# Patient Record
Sex: Male | Born: 1935 | Race: White | Hispanic: No | Smoking: Former smoker
Health system: Southern US, Community
[De-identification: ages and names within clinical notes are randomized; demographics above are authoritative.]

## PROBLEM LIST (undated history)

## (undated) DIAGNOSIS — F0280 Dementia in other diseases classified elsewhere without behavioral disturbance: Secondary | ICD-10-CM

## (undated) DIAGNOSIS — D509 Iron deficiency anemia, unspecified: Secondary | ICD-10-CM

## (undated) DIAGNOSIS — Z126 Encounter for screening for malignant neoplasm of bladder: Secondary | ICD-10-CM

---

## 2020-06-14 ENCOUNTER — Other Ambulatory Visit: Payer: Self-pay

## 2020-06-14 ENCOUNTER — Emergency Department (HOSPITAL_COMMUNITY): Payer: Medicare Other

## 2020-06-14 ENCOUNTER — Emergency Department (HOSPITAL_COMMUNITY)
Admission: EM | Admit: 2020-06-14 | Discharge: 2020-06-14 | Disposition: A | Discharge status: Home / Self Care | Payer: Medicare Other | Attending: Emergency Medicine | Admitting: Emergency Medicine

## 2020-06-14 ENCOUNTER — Encounter (HOSPITAL_COMMUNITY): Payer: Self-pay | Admitting: Emergency Medicine

## 2020-06-14 DIAGNOSIS — W19XXXA Unspecified fall, initial encounter: Secondary | ICD-10-CM | POA: Diagnosis not present

## 2020-06-14 DIAGNOSIS — S0993XA Unspecified injury of face, initial encounter: Secondary | ICD-10-CM | POA: Diagnosis present

## 2020-06-14 DIAGNOSIS — S0011XA Contusion of right eyelid and periocular area, initial encounter: Secondary | ICD-10-CM | POA: Diagnosis not present

## 2020-06-14 DIAGNOSIS — S0083XA Contusion of other part of head, initial encounter: Secondary | ICD-10-CM | POA: Diagnosis not present

## 2020-06-14 NOTE — ED Triage Notes (Signed)
BIBA Per EMS: Pt coming from [facility] with an unwitnessed fall. Pt states he doesn't remember falling but does have a black eye.  denies blood thinners  Hx dementia. Pt ambulatory. All vitals WDL.

## 2020-06-14 NOTE — Discharge Instructions (Addendum)
CT scan head neck and face without any brain or bony injury.  There is soft tissue bruising and swelling around the right eye area.  Patient stable for discharge back to nursing facility.  No specific therapy needed.

## 2020-06-14 NOTE — ED Provider Notes (Signed)
[facility]-EMERGENCY DEPT Provider Note   CSN: [id number] Arrival date & time: 06/14/20  1502     History Chief Complaint  Patient presents with  . Fall    Marcus Hansen is a 87 y.o. male.  Patient brought in from [facility].  Sounds like there was a unwitnessed fall 2 days ago.  Patient with a lot of bruising around his right eye and right forehead area.  Patient has been able to ambulate.  Patient has a history of dementia.  But no specific complaints.        History reviewed. No pertinent past medical history.  There are no problems to display for this patient.        History reviewed. No pertinent family history.     Home Medications Prior to Admission medications   Not on File    Allergies    Patient has no allergy information on record.  Review of Systems   Review of Systems  Unable to perform ROS: Dementia    Physical Exam Updated Vital Signs BP 140/62 (BP Location: Right Arm)   Pulse 64   Temp 98.1 F (36.7 C) (Oral)   Resp 18   SpO2 96%   Physical Exam Vitals and nursing note reviewed.  Constitutional:      General: He is not in acute distress.    Appearance: He is well-developed.  HENT:     Head: Normocephalic.     Comments: Dark contusion to the right forehead area and around the right eye. Eyes:     Extraocular Movements: Extraocular movements intact.     Pupils: Pupils are equal, round, and reactive to light.     Comments: Right eye with some blood in the sclera.  No hyphema.  Extraocular muscles are intact.  Cardiovascular:     Rate and Rhythm: Normal rate and regular rhythm.     Heart sounds: No murmur heard.   Pulmonary:     Effort: Pulmonary effort is normal. No respiratory distress.     Breath sounds: Normal breath sounds.  Abdominal:     Palpations: Abdomen is soft.     Tenderness: There is no abdominal tenderness.  Musculoskeletal:        General: Normal range of motion.     Cervical  back: Neck supple.     Comments: No pain or discomfort with range of motion upper extremities lower extremities.  Including at the hip.  Skin:    General: Skin is warm and dry.     Capillary Refill: Capillary refill takes less than 2 seconds.  Neurological:     General: No focal deficit present.     Mental Status: He is alert. Mental status is at baseline.     Cranial Nerves: No cranial nerve deficit.     Sensory: No sensory deficit.     Motor: No weakness.     ED Results / Procedures / Treatments   Labs (all labs ordered are listed, but only abnormal results are displayed) Labs Reviewed - No data to display  EKG None  Radiology CT Head Wo Contrast  Result Date: 06/14/2020 CLINICAL DATA:  Un witnessed fall, ecchymosis EXAM: CT HEAD WITHOUT CONTRAST CT MAXILLOFACIAL WITHOUT CONTRAST CT CERVICAL SPINE WITHOUT CONTRAST TECHNIQUE: Multidetector CT imaging of the head, cervical spine, and maxillofacial structures were performed using the standard protocol without intravenous contrast. Multiplanar CT image reconstructions of the cervical spine and maxillofacial structures were also generated. COMPARISON:  None. FINDINGS: CT HEAD  FINDINGS Brain: No acute infarct or hemorrhage. Lateral ventricles and midline structures are unremarkable. No acute extra-axial fluid collections. No mass effect. Diffuse cerebral atrophy is age appropriate. Vascular: No hyperdense vessel or unexpected calcification. Skull: Minimal right frontal scalp hematoma. No underlying fracture. Remainder of the calvarium is unremarkable. Other: None. CT MAXILLOFACIAL FINDINGS Osseous: No fracture or mandibular dislocation. No destructive process. Orbits: Negative. No traumatic or inflammatory finding. Sinuses: Clear. Soft tissues: Mild right periorbital soft tissue edema. CT CERVICAL SPINE FINDINGS Alignment: Alignment is anatomic. Skull base and vertebrae: No acute fracture. No primary bone lesion or focal pathologic process. Soft  tissues and spinal canal: No prevertebral fluid or swelling. No visible canal hematoma. Disc levels: Multilevel spondylosis and facet hypertrophy, most pronounced at C4-5, C5-6, and C6-7. Upper chest: Airway is patent.  Lung apices are clear. Other: Reconstructed images demonstrate no additional findings. IMPRESSION: 1. No acute intracranial process. 2. No acute facial bone fracture. 3. No acute cervical spine fracture. 4. Minimal right periorbital soft tissue swelling. 5. Multilevel cervical spondylosis and facet hypertrophy. Electronically Signed   By: Sharlet Salina M.D.   On: 06/14/2020 16:55   CT Cervical Spine Wo Contrast  Result Date: 06/14/2020 CLINICAL DATA:  Un witnessed fall, ecchymosis EXAM: CT HEAD WITHOUT CONTRAST CT MAXILLOFACIAL WITHOUT CONTRAST CT CERVICAL SPINE WITHOUT CONTRAST TECHNIQUE: Multidetector CT imaging of the head, cervical spine, and maxillofacial structures were performed using the standard protocol without intravenous contrast. Multiplanar CT image reconstructions of the cervical spine and maxillofacial structures were also generated. COMPARISON:  None. FINDINGS: CT HEAD FINDINGS Brain: No acute infarct or hemorrhage. Lateral ventricles and midline structures are unremarkable. No acute extra-axial fluid collections. No mass effect. Diffuse cerebral atrophy is age appropriate. Vascular: No hyperdense vessel or unexpected calcification. Skull: Minimal right frontal scalp hematoma. No underlying fracture. Remainder of the calvarium is unremarkable. Other: None. CT MAXILLOFACIAL FINDINGS Osseous: No fracture or mandibular dislocation. No destructive process. Orbits: Negative. No traumatic or inflammatory finding. Sinuses: Clear. Soft tissues: Mild right periorbital soft tissue edema. CT CERVICAL SPINE FINDINGS Alignment: Alignment is anatomic. Skull base and vertebrae: No acute fracture. No primary bone lesion or focal pathologic process. Soft tissues and spinal canal: No prevertebral  fluid or swelling. No visible canal hematoma. Disc levels: Multilevel spondylosis and facet hypertrophy, most pronounced at C4-5, C5-6, and C6-7. Upper chest: Airway is patent.  Lung apices are clear. Other: Reconstructed images demonstrate no additional findings. IMPRESSION: 1. No acute intracranial process. 2. No acute facial bone fracture. 3. No acute cervical spine fracture. 4. Minimal right periorbital soft tissue swelling. 5. Multilevel cervical spondylosis and facet hypertrophy. Electronically Signed   By: Sharlet Salina M.D.   On: 06/14/2020 16:55   CT Maxillofacial WO CM  Result Date: 06/14/2020 CLINICAL DATA:  Un witnessed fall, ecchymosis EXAM: CT HEAD WITHOUT CONTRAST CT MAXILLOFACIAL WITHOUT CONTRAST CT CERVICAL SPINE WITHOUT CONTRAST TECHNIQUE: Multidetector CT imaging of the head, cervical spine, and maxillofacial structures were performed using the standard protocol without intravenous contrast. Multiplanar CT image reconstructions of the cervical spine and maxillofacial structures were also generated. COMPARISON:  None. FINDINGS: CT HEAD FINDINGS Brain: No acute infarct or hemorrhage. Lateral ventricles and midline structures are unremarkable. No acute extra-axial fluid collections. No mass effect. Diffuse cerebral atrophy is age appropriate. Vascular: No hyperdense vessel or unexpected calcification. Skull: Minimal right frontal scalp hematoma. No underlying fracture. Remainder of the calvarium is unremarkable. Other: None. CT MAXILLOFACIAL FINDINGS Osseous: No fracture or mandibular dislocation. No destructive  process. Orbits: Negative. No traumatic or inflammatory finding. Sinuses: Clear. Soft tissues: Mild right periorbital soft tissue edema. CT CERVICAL SPINE FINDINGS Alignment: Alignment is anatomic. Skull base and vertebrae: No acute fracture. No primary bone lesion or focal pathologic process. Soft tissues and spinal canal: No prevertebral fluid or swelling. No visible canal hematoma.  Disc levels: Multilevel spondylosis and facet hypertrophy, most pronounced at C4-5, C5-6, and C6-7. Upper chest: Airway is patent.  Lung apices are clear. Other: Reconstructed images demonstrate no additional findings. IMPRESSION: 1. No acute intracranial process. 2. No acute facial bone fracture. 3. No acute cervical spine fracture. 4. Minimal right periorbital soft tissue swelling. 5. Multilevel cervical spondylosis and facet hypertrophy. Electronically Signed   By: Sharlet Salina M.D.   On: 06/14/2020 16:55    Procedures Procedures   Medications Ordered in ED Medications - No data to display  ED Course  I have reviewed the triage vital signs and the nursing notes.  Pertinent labs & imaging results that were available during my care of the patient were reviewed by me and considered in my medical decision making (see chart for details).    MDM Rules/Calculators/A&P                          Patient daughter states he is not on blood thinners.  Appears that the fall probably occurred 2 days ago.  Daughter was contacted yesterday about fall.  CT head neck and face without any acute bony abnormalities or any injury to the brain.  Patient is ambulatory was prior to arrival.  Does not seem to have any pain in the hips or lower extremities or upper extremities.  The right eye does not show evidence of hyphema.  There is a little bit of sclera hemorrhage.  Extraocular muscles are intact.  No signs of entrapment.  Patient stable for discharge back to [facility].    Final Clinical Impression(s) / ED Diagnoses Final diagnoses:  Fall, initial encounter  Contusion of face, initial encounter    Rx / DC Orders ED Discharge Orders    None       Vanetta Mulders, MD 06/14/20 1725

## 2020-11-03 ENCOUNTER — Other Ambulatory Visit: Payer: Self-pay

## 2020-11-03 ENCOUNTER — Emergency Department (HOSPITAL_BASED_OUTPATIENT_CLINIC_OR_DEPARTMENT_OTHER)
Admission: EM | Admit: 2020-11-03 | Discharge: 2020-11-03 | Disposition: A | Discharge status: Home / Self Care | Payer: Medicare Other | Attending: Emergency Medicine | Admitting: Emergency Medicine

## 2020-11-03 ENCOUNTER — Emergency Department (HOSPITAL_BASED_OUTPATIENT_CLINIC_OR_DEPARTMENT_OTHER): Payer: Medicare Other

## 2020-11-03 ENCOUNTER — Encounter (HOSPITAL_BASED_OUTPATIENT_CLINIC_OR_DEPARTMENT_OTHER): Payer: Self-pay | Admitting: *Deleted

## 2020-11-03 DIAGNOSIS — S0181XA Laceration without foreign body of other part of head, initial encounter: Secondary | ICD-10-CM | POA: Diagnosis not present

## 2020-11-03 DIAGNOSIS — F0280 Dementia in other diseases classified elsewhere without behavioral disturbance: Secondary | ICD-10-CM | POA: Diagnosis not present

## 2020-11-03 DIAGNOSIS — S0990XA Unspecified injury of head, initial encounter: Secondary | ICD-10-CM | POA: Diagnosis present

## 2020-11-03 DIAGNOSIS — W01198A Fall on same level from slipping, tripping and stumbling with subsequent striking against other object, initial encounter: Secondary | ICD-10-CM | POA: Insufficient documentation

## 2020-11-03 DIAGNOSIS — G309 Alzheimer's disease, unspecified: Secondary | ICD-10-CM | POA: Diagnosis not present

## 2020-11-03 DIAGNOSIS — Z87891 Personal history of nicotine dependence: Secondary | ICD-10-CM | POA: Diagnosis not present

## 2020-11-03 HISTORY — DX: Iron deficiency anemia, unspecified: D50.9

## 2020-11-03 HISTORY — DX: Dementia in other diseases classified elsewhere, unspecified severity, without behavioral disturbance, psychotic disturbance, mood disturbance, and anxiety: F02.80

## 2020-11-03 HISTORY — DX: Encounter for screening for malignant neoplasm of bladder: Z12.6

## 2020-11-03 MED ORDER — LIDOCAINE HCL (PF) 1 % IJ SOLN
10.0000 mL | Freq: Once | INTRAMUSCULAR | Status: AC
  Administered 2020-11-03: 10 mL via INTRADERMAL
  Filled 2020-11-03: qty 10

## 2020-11-03 NOTE — Discharge Instructions (Addendum)
The CT scan today of Marcus Hansen's brain did not show any signs of a brain bleed or skull fracture.  I put 5 stitches in his forehead.  The stitches will need to be removed in 5 days (Monday Sept 5th).  This can be done by his primary care provider, at an urgent care, or any medical facility.  Please keep his wound dry for 24 hours.  After that he can take down the Kerlex bandage and leave the wound open to air, and he can also shower normally.  It is okay if the wound gets wet, as long as it is gently dried off.

## 2020-11-03 NOTE — ED Provider Notes (Signed)
[facility] EMERGENCY DEPT Provider Note   CSN: [id number] Arrival date & time: 11/03/20  1211     History Chief Complaint  Patient presents with   Fall   Laceration    Marcus Hansen is a 87 y.o. male with history of Alzheimer's dementia presenting from nursing facility with concern for fall and head injury.  Patient reports that he lost his footing and fell forward striking his forehead on the ground.  He has a laceration to the forehead.  No loss of consciousness.  He reports he is not on blood thinners and per his med rec review does not appear to be on any anticoagulation.  He denies injuries or pain anywhere else.  HPI     Past Medical History:  Diagnosis Date   Alzheimer's disease (HCC)    Iron deficiency anemia    Screening, malignant neoplasm, bladder     There are no problems to display for this patient.   History reviewed. No pertinent surgical history.     History reviewed. No pertinent family history.  Social History   Tobacco Use   Smoking status: Former    Types: Cigarettes   Smokeless tobacco: Never  Vaping Use   Vaping Use: Never used  Substance Use Topics   Alcohol use: Not Currently   Drug use: Never    Home Medications Prior to Admission medications   Medication Sig Start Date End Date Taking? Authorizing Provider  cetirizine (ZYRTEC) 5 MG tablet Take 5 mg by mouth daily.   Yes [provider]  donepezil (ARICEPT) 10 MG tablet Take 10 mg by mouth at bedtime.   Yes [provider]  meloxicam (MOBIC) 7.5 MG tablet Take 7.5 mg by mouth daily.   Yes [provider]  memantine (NAMENDA) 5 MG tablet Take 5 mg by mouth 2 (two) times daily.   Yes [provider]  omeprazole (PRILOSEC) 40 MG capsule Take 40 mg by mouth daily.   Yes [provider]  QUEtiapine (SEROQUEL) 25 MG tablet Take 25 mg by mouth at bedtime.   Yes [provider]    Allergies    Patient has no known  allergies.  Review of Systems   Review of Systems  Constitutional:  Negative for chills and fever.  HENT:  Negative for ear pain and sore throat.   Eyes:  Negative for pain and visual disturbance.  Respiratory:  Negative for cough and shortness of breath.   Cardiovascular:  Negative for chest pain and palpitations.  Gastrointestinal:  Negative for abdominal pain and vomiting.  Genitourinary:  Negative for dysuria and hematuria.  Musculoskeletal:  Negative for arthralgias and back pain.  Skin:  Negative for color change and rash.  Neurological:  Negative for syncope, light-headedness and headaches.  All other systems reviewed and are negative.  Physical Exam Updated Vital Signs BP (!) 143/65 (BP Location: Right Arm)   Pulse 60   Temp 98.7 F (37.1 C)   Resp 18   Ht 6\' 3"  (1.905 m)   Wt 75.8 kg   SpO2 97%   BMI 20.87 kg/m   Physical Exam Constitutional:      General: He is not in acute distress. HENT:     Head: Normocephalic.     Comments: 1.5 inch linear vertical laceration down mid forehead Eyes:     Conjunctiva/sclera: Conjunctivae normal.     Pupils: Pupils are equal, round, and reactive to light.  Cardiovascular:     Rate and Rhythm: Normal  rate and regular rhythm.  Pulmonary:     Effort: Pulmonary effort is normal. No respiratory distress.  Abdominal:     General: There is no distension.     Tenderness: There is no abdominal tenderness.  Skin:    General: Skin is warm and dry.  Neurological:     General: No focal deficit present.     Mental Status: He is alert. Mental status is at baseline.    ED Results / Procedures / Treatments   Labs (all labs ordered are listed, but only abnormal results are displayed) Labs Reviewed - No data to display  EKG None  Radiology CT HEAD WO CONTRAST ( )  Result Date: 11/03/2020 CLINICAL DATA:  Head trauma, minor (Age >= 65y) forehead injury. Forehead injury. Additional history provided: Fall this morning, laceration  to forehead. EXAM: CT HEAD WITHOUT CONTRAST TECHNIQUE: Contiguous axial images were obtained from the base of the skull through the vertex without intravenous contrast. COMPARISON:  CT head/maxillofacial 06/14/2020. FINDINGS: Brain: Mild to moderate generalized cerebral atrophy. Mild patchy and ill-defined hypoattenuation within the white matter, nonspecific but compatible with chronic small vessel ischemic disease. There is no acute intracranial hemorrhage. No demarcated cortical infarct. No extra-axial fluid collection. No evidence of an intracranial mass. No midline shift. 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 Vascular: No hyperdense vessel.  Atherosclerotic calcifications. Skull: Normal. Negative for fracture or focal lesion. Sinuses/Orbits: Visualized orbits show no acute finding. Mild scattered paranasal sinus mucosal thickening at the imaged levels. Other: Forehead laceration. IMPRESSION: No evidence of acute intracranial abnormality.  Forehead laceration. Mild chronic small-vessel ischemic changes within the white matter. Mild-to-moderate generalized cerebral atrophy. Mild paranasal sinus mucosal thickening at the imaged levels. Electronically Signed   By: Jackey Loge D.O.   On: 11/03/2020 13:12    Procedures .Marland KitchenLaceration Repair  Date/Time: 11/03/2020 4:49 PM Performed by: Terald Sleeper, MD Authorized by: Terald Sleeper, MD   Consent:    Consent obtained:  Verbal   Risks discussed:  Infection, pain, poor cosmetic result, poor wound healing and need for additional repair Universal protocol:    Procedure explained and questions answered to patient or proxy's satisfaction: yes     Relevant documents present and verified: yes     Immediately prior to procedure, a time out was called: yes     Patient identity confirmed:  Arm band Anesthesia:    Anesthesia method:  Local infiltration   Local anesthetic:  Lidocaine 1% w/o epi Laceration details:     Location:  Face   Face location:  Forehead   Length (cm):  5   Depth (mm):  3 Pre-procedure details:    Preparation:  Patient was prepped and draped in usual sterile fashion Exploration:    Imaging outcome: foreign body not noted     Wound exploration: wound explored through full range of motion and entire depth of wound visualized     Contaminated: no   Treatment:    Area cleansed with:  Saline   Amount of cleaning:  Standard Skin repair:    Repair method:  Sutures   Suture size:  4-0   Suture material:  Prolene   Suture technique:  Simple interrupted   Number of sutures:  5 Approximation:    Approximation:  Close Repair type:    Repair type:  Simple Post-procedure details:    Dressing:  Adhesive bandage   Procedure completion:  Tolerated well, no immediate complications   Medications Ordered in ED  Medications  lidocaine (PF) (XYLOCAINE) 1 % injection 10 mL (10 mLs Intradermal Given 11/03/20 1336)    ED Course  I have reviewed the triage vital signs and the nursing notes.  Pertinent labs & imaging results that were available during my care of the patient were reviewed by me and considered in my medical decision making (see chart for details).  Patient is here with suspected mechanical fall and for head injury.  CT scan of the brain ordered.  No other acute injuries noted my exam.  He does have a laceration to forehead which will need to be irrigated and require suture closure.  Patient is otherwise well-appearing.`  Laceration irrigated and repaired and dressed at bedside.  Clinical Course as of 11/03/20 1649  Wed Nov 03, 2020  1329  IMPRESSION: No evidence of acute intracranial abnormality.  Forehead laceration.   Mild chronic small-vessel ischemic changes within the white matter.   Mild-to-moderate generalized cerebral atrophy.   Mild paranasal sinus mucosal thickening at the imaged levels.  [MT]    Clinical Course User Index [MT] Goldman Birchall, Kermit Balo, MD     Final Clinical Impression(s) / ED Diagnoses Final diagnoses:  Laceration of forehead, initial encounter    Rx / DC Orders ED Discharge Orders     None        Terald Sleeper, MD 11/03/20 1650

## 2020-11-03 NOTE — ED Notes (Signed)
Dr. Renaye Rakers is suturing his forehead wound as I write this.

## 2020-11-03 NOTE — ED Notes (Signed)
He remains comfortable. His niece phones and tells [place] she will come here to pick him up within ~20 min. I have just notified [facility] to cancel this transport request.

## 2020-11-03 NOTE — ED Triage Notes (Addendum)
Larey Seat this morning and per report patient fell d/t mechanical fall???  Patient does not know how he fall.  Laceration to forehead.

## 2020-11-30 ENCOUNTER — Other Ambulatory Visit: Payer: Self-pay

## 2020-11-30 ENCOUNTER — Emergency Department (HOSPITAL_COMMUNITY)
Admission: EM | Admit: 2020-11-30 | Discharge: 2020-12-01 | Disposition: A | Discharge status: Home / Self Care | Payer: Medicare Other | Attending: Emergency Medicine | Admitting: Emergency Medicine

## 2020-11-30 ENCOUNTER — Encounter (HOSPITAL_COMMUNITY): Payer: Self-pay | Admitting: Emergency Medicine

## 2020-11-30 ENCOUNTER — Emergency Department (HOSPITAL_COMMUNITY): Payer: Medicare Other

## 2020-11-30 DIAGNOSIS — S0081XA Abrasion of other part of head, initial encounter: Secondary | ICD-10-CM | POA: Insufficient documentation

## 2020-11-30 DIAGNOSIS — R059 Cough, unspecified: Secondary | ICD-10-CM | POA: Diagnosis not present

## 2020-11-30 DIAGNOSIS — G309 Alzheimer's disease, unspecified: Secondary | ICD-10-CM | POA: Diagnosis not present

## 2020-11-30 DIAGNOSIS — S0990XA Unspecified injury of head, initial encounter: Secondary | ICD-10-CM | POA: Diagnosis present

## 2020-11-30 DIAGNOSIS — E876 Hypokalemia: Secondary | ICD-10-CM

## 2020-11-30 DIAGNOSIS — R197 Diarrhea, unspecified: Secondary | ICD-10-CM | POA: Diagnosis not present

## 2020-11-30 DIAGNOSIS — Z20822 Contact with and (suspected) exposure to covid-19: Secondary | ICD-10-CM | POA: Insufficient documentation

## 2020-11-30 DIAGNOSIS — X58XXXA Exposure to other specified factors, initial encounter: Secondary | ICD-10-CM | POA: Insufficient documentation

## 2020-11-30 DIAGNOSIS — Z87891 Personal history of nicotine dependence: Secondary | ICD-10-CM | POA: Diagnosis not present

## 2020-11-30 DIAGNOSIS — K591 Functional diarrhea: Secondary | ICD-10-CM

## 2020-11-30 LAB — COMPREHENSIVE METABOLIC PANEL
ALT: 9 U/L (ref 0–44)
AST: 14 U/L — ABNORMAL LOW (ref 15–41)
Albumin: 3.1 g/dL — ABNORMAL LOW (ref 3.5–5.0)
Alkaline Phosphatase: 52 U/L (ref 38–126)
Anion gap: 5 (ref 5–15)
BUN: 18 mg/dL (ref 8–23)
CO2: 28 mmol/L (ref 22–32)
Calcium: 8.7 mg/dL — ABNORMAL LOW (ref 8.9–10.3)
Chloride: 110 mmol/L (ref 98–111)
Creatinine, Ser: 0.78 mg/dL (ref 0.61–1.24)
GFR, Estimated: >60 mL/min (ref >60)
Glucose, Bld: 94 mg/dL (ref 70–99)
Potassium: 2.9 mmol/L — ABNORMAL LOW (ref 3.5–5.1)
Sodium: 143 mmol/L (ref 135–145)
Total Bilirubin: 0.8 mg/dL (ref 0.3–1.2)
Total Protein: 5.4 g/dL — ABNORMAL LOW (ref 6.5–8.1)

## 2020-11-30 LAB — PROTIME-INR
INR: 1 (ref 0.8–1.2)
Prothrombin Time: 13.6 seconds (ref 11.4–15.2)

## 2020-11-30 LAB — CBC
HCT: 39.1 % (ref 39.0–52.0)
Hemoglobin: 12.9 g/dL — ABNORMAL LOW (ref 13.0–17.0)
MCH: 29.9 pg (ref 26.0–34.0)
MCHC: 33 g/dL (ref 30.0–36.0)
MCV: 90.7 fL (ref 80.0–100.0)
Platelets: 163 10*3/uL (ref 150–400)
RBC: 4.31 MIL/uL (ref 4.22–5.81)
RDW: 13.3 % (ref 11.5–15.5)
WBC: 7.5 10*3/uL (ref 4.0–10.5)
nRBC: 0 % (ref 0.0–0.2)

## 2020-11-30 LAB — APTT: aPTT: 32 seconds (ref 24–36)

## 2020-11-30 LAB — LACTIC ACID, PLASMA: Lactic Acid, Venous: 0.9 mmol/L (ref 0.5–1.9)

## 2020-11-30 LAB — LIPASE, BLOOD: Lipase: 26 U/L (ref 11–51)

## 2020-11-30 MED ORDER — POTASSIUM CHLORIDE 10 MEQ/100ML IV SOLN
10.0000 meq | INTRAVENOUS | Status: AC
  Administered 2020-12-01 (×2): 10 meq via INTRAVENOUS
  Filled 2020-11-30: qty 100

## 2020-11-30 MED ORDER — LACTATED RINGERS IV BOLUS
1000.0000 mL | Freq: Once | INTRAVENOUS | Status: AC
  Administered 2020-11-30: 1000 mL via INTRAVENOUS

## 2020-11-30 NOTE — ED Triage Notes (Signed)
Per EMS-diarrhea for 2 days-temp of 103.2-facility worried that he is dehydrated-decreased appetite for 2 days-1000 mg of tylenol and 500 mg of NS given in route-history of dementia

## 2020-11-30 NOTE — ED Notes (Signed)
Daughter at bedside.

## 2020-11-30 NOTE — ED Provider Notes (Signed)
[facility]-EMERGENCY DEPT Provider Note   CSN: [id number] Arrival date & time: 11/30/20  1833     History Chief Complaint  Patient presents with   Diarrhea    Marcus Hansen is a 87 y.o. male.  Patient is an 87 year old male with a history of Alzheimer's disease who lives in a facility being brought in today by EMS due to facility reporting 2 to 3 days of diarrhea, decreased oral intake today and fever of 103.2.  They denied that patient had any cough, congestion or shortness of breath.  He has not had any vomiting.  No blood in the stool.  Patient denies having any abdominal pain or chest pain at this time.  He does not know why he is here and cannot give any further history.  The history is provided by the EMS personnel and medical records. The history is limited by the absence of a caregiver.  Diarrhea     Past Medical History:  Diagnosis Date   Alzheimer's disease (HCC)    Iron deficiency anemia    Screening, malignant neoplasm, bladder     There are no problems to display for this patient.   History reviewed. No pertinent surgical history.     No family history on file.  Social History   Tobacco Use   Smoking status: Former    Types: Cigarettes   Smokeless tobacco: Never  Vaping Use   Vaping Use: Never used  Substance Use Topics   Alcohol use: Not Currently   Drug use: Never    Home Medications Prior to Admission medications   Medication Sig Start Date End Date Taking? Authorizing Provider  cetirizine (ZYRTEC) 5 MG tablet Take 5 mg by mouth daily.   Yes [provider]  donepezil (ARICEPT) 10 MG tablet Take 10 mg by mouth at bedtime.   Yes [provider]  meloxicam (MOBIC) 7.5 MG tablet Take 7.5 mg by mouth daily.   Yes [provider]  memantine (NAMENDA) 5 MG tablet Take 5 mg by mouth 2 (two) times daily.   Yes [provider]  omeprazole (PRILOSEC) 40 MG capsule Take 40 mg by mouth daily.    Yes [provider]    Allergies    Patient has no known allergies.  Review of Systems   Review of Systems  Unable to perform ROS: Dementia  Gastrointestinal:  Positive for diarrhea.   Physical Exam Updated Vital Signs BP (!) 150/48   Pulse (!) 30   Temp 98 F (36.7 C) (Rectal)   Resp 16   SpO2 94%   Physical Exam Vitals and nursing note reviewed.  Constitutional:      General: He is not in acute distress.    Appearance: He is well-developed.  HENT:     Head: Normocephalic.     Comments: Healing abrasion over the upper central forehead    Nose: Nose normal.     Mouth/Throat:     Mouth: Mucous membranes are moist.  Eyes:     Conjunctiva/sclera: Conjunctivae normal.     Pupils: Pupils are equal, round, and reactive to light.  Cardiovascular:     Rate and Rhythm: Normal rate and regular rhythm.     Heart sounds: No murmur heard. Pulmonary:     Effort: Pulmonary effort is normal. No respiratory distress.     Breath sounds: Normal breath sounds. No wheezing or rales.  Abdominal:     General: There is no distension.  Palpations: Abdomen is soft.     Tenderness: There is no abdominal tenderness. There is no guarding or rebound.  Musculoskeletal:        General: No tenderness. Normal range of motion.     Cervical back: Normal range of motion and neck supple.     Right lower leg: No edema.     Left lower leg: No edema.     Comments: Dried stool on the legs  Skin:    General: Skin is warm and dry.     Findings: No erythema or rash.  Neurological:     Mental Status: He is alert.     Comments: Oriented to person only  Psychiatric:     Comments: Calm and cooperative    ED Results / Procedures / Treatments   Labs (all labs ordered are listed, but only abnormal results are displayed) Labs Reviewed  COMPREHENSIVE METABOLIC PANEL - Abnormal; Notable for the following components:      Result Value   Potassium 2.9 (*)    Calcium 8.7 (*)    Total Protein  5.4 (*)    Albumin 3.1 (*)    AST 14 (*)    All other components within normal limits  CBC - Abnormal; Notable for the following components:   Hemoglobin 12.9 (*)    All other components within normal limits  RESP PANEL BY RT-PCR (FLU A&B, COVID) [id number]  CULTURE, BLOOD (SINGLE)  URINE CULTURE  GASTROINTESTINAL PANEL BY PCR, STOOL (REPLACES STOOL CULTURE)  LIPASE, BLOOD  LACTIC ACID, PLASMA  PROTIME-INR  APTT  URINALYSIS, ROUTINE W REFLEX MICROSCOPIC  LACTIC ACID, PLASMA    EKG EKG Interpretation  Date/Time:  Tuesday November 30 2020 21:06:16 EDT Ventricular Rate:  53 PR Interval:  117 QRS Duration: 132 QT Interval:  513 QTC Calculation: 482 R Axis:     Text Interpretation: EASI Derived Leads T wave inversions in the inferior leads No prior EKG for comparison No STEMI Confirmed by Ernie Avena ([id number]) on 11/30/2020 9:41:49 PM  Radiology DG Chest Port 1 View  Result Date: 11/30/2020 CLINICAL DATA:  Question sepsis. EXAM: PORTABLE CHEST 1 VIEW COMPARISON:  None. FINDINGS: The heart and mediastinal contours are within normal limits. Slightly prominent hilar vasculature. No focal consolidation. No pulmonary edema. No pleural effusion. No pneumothorax. No acute osseous abnormality. Old healed left proximal humerus fracture. IMPRESSION: No active disease. Electronically Signed   By: Tish Frederickson M.D.   On: 11/30/2020 20:08    Procedures Procedures   Medications Ordered in ED Medications  potassium chloride 10 mEq in 100 mL IVPB (10 mEq Intravenous New Bag/Given 12/01/20 0009)  loperamide (IMODIUM) capsule 4 mg (has no administration in time range)  lactated ringers infusion (has no administration in time range)  lactated ringers bolus 1,000 mL (1,000 mLs Intravenous New Bag/Given 11/30/20 2300)    ED Course  I have reviewed the triage vital signs and the nursing notes.  Pertinent labs & imaging results that were available during my care of the patient were reviewed by me  and considered in my medical decision making (see chart for details).    MDM Rules/Calculators/A&P                           Elderly male presenting today due to concern for sepsis.  He has had several days of diarrhea and today had a temperature of 103.  He has had poor oral intake today and facility was  concerned about dehydration.  On exam patient has no abdominal pain, chest is clear and oxygen saturation of 96% on room air.  Undifferentiated sepsis possibly initiated.  Still waiting on a temperature here.  Patient given IV fluids.  Labs are pending.  12:30 AM Rectal temperature here is normal at 98.  Chest x-ray, lab work without acute findings except for hypokalemia of 2.9 which is most likely related to patient's diarrhea.  Family member is now here who reports that he has had the diarrhea for a week and poor oral intake and she is concerned about dehydration.  Patient will be replaced with potassium with IV x2 rounds.  Patient is still awake alert and will give a dose of Imodium.  Discussed with family member that he will need GI follow-up with the diarrhea continues but for now can try Imodium to decrease the diarrhea.  She denies any recent antibiotic use to suggest concern for C. difficile.  No other known people at the facility with similar symptoms with less concern for for foodborne illness.  Patient still has no abdominal pain to suggest that he needs any imaging at this time.  UA is still pending.  MDM   Amount and/or Complexity of Data Reviewed Clinical lab tests: ordered and reviewed Tests in the radiology section of CPT: ordered and reviewed Tests in the medicine section of CPT: ordered and reviewed Independent visualization of images, tracings, or specimens: yes    Final Clinical Impression(s) / ED Diagnoses Final diagnoses:  None    Rx / DC Orders ED Discharge Orders     None        Gwyneth Sprout, MD 12/01/20 0031

## 2020-11-30 NOTE — ED Notes (Signed)
Applied condom cath on pt and a brief. Pt laying in bed comfortably w/ HOB elevated.

## 2020-12-01 LAB — RESP PANEL BY RT-PCR (FLU A&B, COVID) ARPGX2
Influenza A by PCR: NEGATIVE
Influenza B by PCR: NEGATIVE
SARS Coronavirus 2 by RT PCR: NEGATIVE

## 2020-12-01 MED ORDER — LOPERAMIDE HCL 2 MG PO CAPS
4.0000 mg | ORAL_CAPSULE | Freq: Once | ORAL | Status: AC
  Administered 2020-12-01: 4 mg via ORAL
  Filled 2020-12-01: qty 2

## 2020-12-01 MED ORDER — LOPERAMIDE HCL 2 MG PO CAPS: 2.0000 mg | ORAL_CAPSULE | Freq: Four times a day (QID) | ORAL | 0 refills | Status: AC | PRN

## 2020-12-01 MED ORDER — LACTATED RINGERS IV SOLN: INTRAVENOUS | Status: DC

## 2020-12-01 MED ORDER — POTASSIUM CHLORIDE CRYS ER 20 MEQ PO TBCR
40.0000 meq | EXTENDED_RELEASE_TABLET | Freq: Once | ORAL | Status: AC
  Administered 2020-12-01: 40 meq via ORAL
  Filled 2020-12-01: qty 2

## 2020-12-01 NOTE — ED Notes (Signed)
Report given to Dois Davenport at [facility]. Patient was transferred back to the facility by his daughter.

## 2020-12-01 NOTE — ED Provider Notes (Signed)
Nursing notes and vitals signs, including pulse oximetry, reviewed.  Summary of this visit's results, reviewed by myself:  EKG:  EKG Interpretation  Date/Time:  Tuesday November 30 2020 21:06:16 EDT Ventricular Rate:  53 PR Interval:  117 QRS Duration: 132 QT Interval:  513 QTC Calculation: 482 R Axis:     Text Interpretation: EASI Derived Leads T wave inversions in the inferior leads No prior EKG for comparison No STEMI Confirmed by Ernie Avena ([id number]) on 11/30/2020 9:41:49 PM        Labs:  Results for orders placed or performed during the hospital encounter of 11/30/20 (from the past 24 hour(s))  Lipase, blood     Status: None   Collection Time: 11/30/20 11:03 PM  Result Value Ref Range   Lipase 26 11 - 51 U/L  Comprehensive metabolic panel     Status: Abnormal   Collection Time: 11/30/20 11:03 PM  Result Value Ref Range   Sodium 143 135 - 145 mmol/L   Potassium 2.9 (L) 3.5 - 5.1 mmol/L   Chloride 110 98 - 111 mmol/L   CO2 28 22 - 32 mmol/L   Glucose, Bld 94 70 - 99 mg/dL   BUN 18 8 - 23 mg/dL   Creatinine, Ser 5.64 0.61 - 1.24 mg/dL   Calcium 8.7 (L) 8.9 - 10.3 mg/dL   Total Protein 5.4 (L) 6.5 - 8.1 g/dL   Albumin 3.1 (L) 3.5 - 5.0 g/dL   AST 14 (L) 15 - 41 U/L   ALT 9 0 - 44 U/L   Alkaline Phosphatase 52 38 - 126 U/L   Total Bilirubin 0.8 0.3 - 1.2 mg/dL   GFR, Estimated >33 >29 mL/min   Anion gap 5 5 - 15  CBC     Status: Abnormal   Collection Time: 11/30/20 11:03 PM  Result Value Ref Range   WBC 7.5 4.0 - 10.5 K/uL   RBC 4.31 4.22 - 5.81 MIL/uL   Hemoglobin 12.9 (L) 13.0 - 17.0 g/dL   HCT 51.8 84.1 - 66.0 %   MCV 90.7 80.0 - 100.0 fL   MCH 29.9 26.0 - 34.0 pg   MCHC 33.0 30.0 - 36.0 g/dL   RDW 63.0 16.0 - 10.9 %   Platelets 163 150 - 400 K/uL   nRBC 0.0 0.0 - 0.2 %  Lactic acid, plasma     Status: None   Collection Time: 11/30/20 11:03 PM  Result Value Ref Range   Lactic Acid, Venous 0.9 0.5 - 1.9 mmol/L  Protime-INR     Status: None   Collection  Time: 11/30/20 11:03 PM  Result Value Ref Range   Prothrombin Time 13.6 11.4 - 15.2 seconds   INR 1.0 0.8 - 1.2  APTT     Status: None   Collection Time: 11/30/20 11:03 PM  Result Value Ref Range   aPTT 32 24 - 36 seconds  Resp Panel by RT-PCR (Flu A&B, Covid) Nasopharyngeal Swab     Status: None   Collection Time: 11/30/20 11:03 PM   Specimen: Nasopharyngeal Swab; Nasopharyngeal(NP) swabs in vial transport medium  Result Value Ref Range   SARS Coronavirus 2 by RT PCR NEGATIVE NEGATIVE   Influenza A by PCR NEGATIVE NEGATIVE   Influenza B by PCR NEGATIVE NEGATIVE    Imaging Studies: DG Chest Port 1 View  Result Date: 11/30/2020 CLINICAL DATA:  Question sepsis. EXAM: PORTABLE CHEST 1 VIEW COMPARISON:  None. FINDINGS: The heart and mediastinal contours are within normal limits. Slightly prominent  hilar vasculature. No focal consolidation. No pulmonary edema. No pleural effusion. No pneumothorax. No acute osseous abnormality. Old healed left proximal humerus fracture. IMPRESSION: No active disease. Electronically Signed   By: Tish Frederickson M.D.   On: 11/30/2020 20:08    3:28 AM Patient has not had no further diarrhea in the ED.'s potassium has infused.     Alfonzia Woolum, Jonny Ruiz, MD 12/01/20 [phone]

## 2020-12-06 LAB — CULTURE, BLOOD (SINGLE)
Culture: NO GROWTH
Special Requests: ADEQUATE

## 2020-12-09 ENCOUNTER — Emergency Department (HOSPITAL_COMMUNITY): Payer: Medicare Other

## 2020-12-09 ENCOUNTER — Other Ambulatory Visit: Payer: Self-pay

## 2020-12-09 ENCOUNTER — Emergency Department (HOSPITAL_COMMUNITY)
Admission: EM | Admit: 2020-12-09 | Discharge: 2020-12-10 | Disposition: A | Discharge status: Home / Self Care | Payer: Medicare Other | Attending: Emergency Medicine | Admitting: Emergency Medicine

## 2020-12-09 DIAGNOSIS — G309 Alzheimer's disease, unspecified: Secondary | ICD-10-CM | POA: Insufficient documentation

## 2020-12-09 DIAGNOSIS — J90 Pleural effusion, not elsewhere classified: Secondary | ICD-10-CM | POA: Insufficient documentation

## 2020-12-09 DIAGNOSIS — J322 Chronic ethmoidal sinusitis: Secondary | ICD-10-CM | POA: Insufficient documentation

## 2020-12-09 DIAGNOSIS — F0280 Dementia in other diseases classified elsewhere without behavioral disturbance: Secondary | ICD-10-CM | POA: Insufficient documentation

## 2020-12-09 DIAGNOSIS — K5792 Diverticulitis of intestine, part unspecified, without perforation or abscess without bleeding: Secondary | ICD-10-CM | POA: Diagnosis not present

## 2020-12-09 DIAGNOSIS — R197 Diarrhea, unspecified: Secondary | ICD-10-CM | POA: Diagnosis present

## 2020-12-09 DIAGNOSIS — K529 Noninfective gastroenteritis and colitis, unspecified: Secondary | ICD-10-CM | POA: Diagnosis not present

## 2020-12-09 DIAGNOSIS — R296 Repeated falls: Secondary | ICD-10-CM | POA: Diagnosis not present

## 2020-12-09 DIAGNOSIS — R627 Adult failure to thrive: Secondary | ICD-10-CM | POA: Insufficient documentation

## 2020-12-09 DIAGNOSIS — J320 Chronic maxillary sinusitis: Secondary | ICD-10-CM | POA: Insufficient documentation

## 2020-12-09 DIAGNOSIS — Z87891 Personal history of nicotine dependence: Secondary | ICD-10-CM | POA: Diagnosis not present

## 2020-12-09 DIAGNOSIS — I447 Left bundle-branch block, unspecified: Secondary | ICD-10-CM | POA: Insufficient documentation

## 2020-12-09 DIAGNOSIS — K8020 Calculus of gallbladder without cholecystitis without obstruction: Secondary | ICD-10-CM | POA: Diagnosis not present

## 2020-12-09 LAB — URINALYSIS, ROUTINE W REFLEX MICROSCOPIC
Bacteria, UA: NONE SEEN
Bilirubin Urine: NEGATIVE
Glucose, UA: NEGATIVE mg/dL
Hgb urine dipstick: NEGATIVE
Ketones, ur: 20 mg/dL — AB
Leukocytes,Ua: NEGATIVE
Nitrite: NEGATIVE
Protein, ur: 30 mg/dL — AB
Specific Gravity, Urine: 1.025 (ref 1.005–1.030)
pH: 5 (ref 5.0–8.0)

## 2020-12-09 LAB — COMPREHENSIVE METABOLIC PANEL
ALT: 8 U/L (ref 0–44)
AST: 20 U/L (ref 15–41)
Albumin: 2.8 g/dL — ABNORMAL LOW (ref 3.5–5.0)
Alkaline Phosphatase: 52 U/L (ref 38–126)
Anion gap: 9 (ref 5–15)
BUN: 15 mg/dL (ref 8–23)
CO2: 25 mmol/L (ref 22–32)
Calcium: 8.5 mg/dL — ABNORMAL LOW (ref 8.9–10.3)
Chloride: 104 mmol/L (ref 98–111)
Creatinine, Ser: 0.77 mg/dL (ref 0.61–1.24)
GFR, Estimated: >60 mL/min (ref >60)
Glucose, Bld: 83 mg/dL (ref 70–99)
Potassium: 3.8 mmol/L (ref 3.5–5.1)
Sodium: 138 mmol/L (ref 135–145)
Total Bilirubin: 1 mg/dL (ref 0.3–1.2)
Total Protein: 5.4 g/dL — ABNORMAL LOW (ref 6.5–8.1)

## 2020-12-09 LAB — CBC WITH DIFFERENTIAL/PLATELET
Abs Immature Granulocytes: 0.01 10*3/uL (ref 0.00–0.07)
Basophils Absolute: 0.1 10*3/uL (ref 0.0–0.1)
Basophils Relative: 1 %
Eosinophils Absolute: 0.1 10*3/uL (ref 0.0–0.5)
Eosinophils Relative: 2 %
HCT: 39.2 % (ref 39.0–52.0)
Hemoglobin: 12.8 g/dL — ABNORMAL LOW (ref 13.0–17.0)
Immature Granulocytes: 0 %
Lymphocytes Relative: 24 %
Lymphs Abs: 1.5 10*3/uL (ref 0.7–4.0)
MCH: 29.9 pg (ref 26.0–34.0)
MCHC: 32.7 g/dL (ref 30.0–36.0)
MCV: 91.6 fL (ref 80.0–100.0)
Monocytes Absolute: 1.2 10*3/uL — ABNORMAL HIGH (ref 0.1–1.0)
Monocytes Relative: 19 %
Neutro Abs: 3.3 10*3/uL (ref 1.7–7.7)
Neutrophils Relative %: 54 %
Platelets: 171 10*3/uL (ref 150–400)
RBC: 4.28 MIL/uL (ref 4.22–5.81)
RDW: 13.9 % (ref 11.5–15.5)
WBC: 6.2 10*3/uL (ref 4.0–10.5)
nRBC: 0 % (ref 0.0–0.2)

## 2020-12-09 LAB — TROPONIN I (HIGH SENSITIVITY): Troponin I (High Sensitivity): 20 ng/L — ABNORMAL HIGH (ref <18)

## 2020-12-09 MED ORDER — METRONIDAZOLE 500 MG PO TABS: 500.0000 mg | ORAL_TABLET | Freq: Three times a day (TID) | ORAL | 0 refills | Status: AC

## 2020-12-09 MED ORDER — CIPROFLOXACIN IN D5W 400 MG/200ML IV SOLN
400.0000 mg | Freq: Once | INTRAVENOUS | Status: AC
Start: 2020-12-09 — End: 2020-12-10
  Administered 2020-12-10: 400 mg via INTRAVENOUS
  Filled 2020-12-09: qty 200

## 2020-12-09 MED ORDER — SODIUM CHLORIDE 0.9 % IV SOLN: INTRAVENOUS | Status: DC

## 2020-12-09 MED ORDER — METRONIDAZOLE 500 MG/100ML IV SOLN
500.0000 mg | Freq: Once | INTRAVENOUS | Status: AC
  Administered 2020-12-10: 500 mg via INTRAVENOUS
  Filled 2020-12-09: qty 100

## 2020-12-09 MED ORDER — IOHEXOL 350 MG/ML SOLN
80.0000 mL | Freq: Once | INTRAVENOUS | Status: AC | PRN
  Administered 2020-12-09: 80 mL via INTRAVENOUS

## 2020-12-09 MED ORDER — CIPROFLOXACIN HCL 500 MG PO TABS: 500.0000 mg | ORAL_TABLET | Freq: Two times a day (BID) | ORAL | 0 refills | Status: AC

## 2020-12-09 NOTE — ED Triage Notes (Signed)
Per EMS pt from home  Family states he has continued to have symptoms like he was seen here for on 9/27.  No fever  No complaints.  Just not eating/drinking as he should.  Hx of dementia.  Family en route.

## 2020-12-09 NOTE — ED Provider Notes (Signed)
[facility]-EMERGENCY DEPT Provider Note   CSN: [id number] Arrival date & time: 12/09/20  1727     History Chief Complaint  Patient presents with   Failure To Thrive    Marcus Hansen is a 87 y.o. male.  Patient is from assisted living.  Patient brought in by EMS accompanied by family member.  Patient was seen September 27 for diarrhea.  Had some electrolyte abnormalities.  Family states that that he is on Imodium but he is still having frequent diarrhea without any blood and not eating and drinking very well.  Patient has a known history of dementia.  No fever.  No vomiting.  Temp here 98.2.  Blood pressure 144/63.  Heart rate 77.  Oxygen saturation on room air 95%.  Patient known to have Alzheimer's known to have iron deficiency anemia.  Also family states patient has had multiple falls and probably has hit his head.      Past Medical History:  Diagnosis Date   Alzheimer's disease (HCC)    Iron deficiency anemia    Screening, malignant neoplasm, bladder     There are no problems to display for this patient.   No past surgical history on file.     No family history on file.  Social History   Tobacco Use   Smoking status: Former    Types: Cigarettes   Smokeless tobacco: Never  Vaping Use   Vaping Use: Never used  Substance Use Topics   Alcohol use: Not Currently   Drug use: Never    Home Medications Prior to Admission medications   Medication Sig Start Date End Date Taking? Authorizing Provider  ciprofloxacin (CIPRO) 500 MG tablet Take 1 tablet (500 mg total) by mouth every 12 (twelve) hours. 12/09/20  Yes Vanetta Mulders, MD  metroNIDAZOLE (FLAGYL) 500 MG tablet Take 1 tablet (500 mg total) by mouth 3 (three) times daily. 12/09/20  Yes Vanetta Mulders, MD  cetirizine (ZYRTEC) 5 MG tablet Take 5 mg by mouth daily.    [provider]  donepezil (ARICEPT) 10 MG tablet Take 10 mg by mouth at bedtime.    [provider]   loperamide (IMODIUM) 2 MG capsule Take 1 capsule (2 mg total) by mouth 4 (four) times daily as needed for diarrhea or loose stools. 12/01/20   Molpus, John, MD  meloxicam (MOBIC) 7.5 MG tablet Take 7.5 mg by mouth daily.    [provider]  memantine (NAMENDA) 5 MG tablet Take 5 mg by mouth 2 (two) times daily.    [provider]  omeprazole (PRILOSEC) 40 MG capsule Take 40 mg by mouth daily.    [provider]    Allergies    Patient has no known allergies.  Review of Systems   Review of Systems  Gastrointestinal:  Positive for diarrhea.  All other systems reviewed and are negative.  Physical Exam Updated Vital Signs BP (!) 146/59   Pulse (!) 54   Temp 98.2 F (36.8 C) (Oral)   Resp 18   Ht 1.829 m (6')   Wt 75.8 kg   SpO2 94%   BMI 22.65 kg/m   Physical Exam Vitals and nursing note reviewed.  Constitutional:      General: He is not in acute distress.    Appearance: Normal appearance. He is well-developed.  HENT:     Head: Normocephalic and atraumatic.     Mouth/Throat:     Mouth: Mucous membranes are dry.  Eyes:  Extraocular Movements: Extraocular movements intact.     Conjunctiva/sclera: Conjunctivae normal.     Pupils: Pupils are equal, round, and reactive to light.  Cardiovascular:     Rate and Rhythm: Normal rate and regular rhythm.     Heart sounds: No murmur heard. Pulmonary:     Effort: Pulmonary effort is normal. No respiratory distress.     Breath sounds: Normal breath sounds.  Abdominal:     General: There is no distension.     Palpations: Abdomen is soft.     Tenderness: There is no abdominal tenderness.  Musculoskeletal:        General: No swelling. Normal range of motion.     Cervical back: Normal range of motion and neck supple.  Skin:    General: Skin is warm and dry.     Capillary Refill: Capillary refill takes less than 2 seconds.  Neurological:     General: No focal deficit present.     Mental Status: He is  alert. Mental status is at baseline.     Cranial Nerves: No cranial nerve deficit.     Sensory: No sensory deficit.     Motor: No weakness.    ED Results / Procedures / Treatments   Labs (all labs ordered are listed, but only abnormal results are displayed) Labs Reviewed  COMPREHENSIVE METABOLIC PANEL - Abnormal; Notable for the following components:      Result Value   Calcium 8.5 (*)    Total Protein 5.4 (*)    Albumin 2.8 (*)    All other components within normal limits  CBC WITH DIFFERENTIAL/PLATELET - Abnormal; Notable for the following components:   Hemoglobin 12.8 (*)    Monocytes Absolute 1.2 (*)    All other components within normal limits  URINALYSIS, ROUTINE W REFLEX MICROSCOPIC - Abnormal; Notable for the following components:   Color, Urine AMBER (*)    Ketones, ur 20 (*)    Protein, ur 30 (*)    All other components within normal limits  TROPONIN I (HIGH SENSITIVITY) - Abnormal; Notable for the following components:   Troponin I (High Sensitivity) 20 (*)    All other components within normal limits  TROPONIN I (HIGH SENSITIVITY)    EKG EKG Interpretation  Date/Time:  Thursday December 09 2020 21:08:55 EDT Ventricular Rate:  57 PR Interval:  196 QRS Duration: 127 QT Interval:  522 QTC Calculation: 509 R Axis:   10 Text Interpretation: Sinus rhythm Left bundle branch block Confirmed by Vanetta Mulders [id number]) on 12/09/2020 9:14:12 PM  Radiology CT Head Wo Contrast  Result Date: 12/09/2020 CLINICAL DATA:  Altered mental status. EXAM: CT HEAD WITHOUT CONTRAST TECHNIQUE: Contiguous axial images were obtained from the base of the skull through the vertex without intravenous contrast. COMPARISON:  November 03, 2020 FINDINGS: Brain: There is mild cerebral atrophy with widening of the extra-axial spaces and ventricular dilatation. There are areas of decreased attenuation within the white matter tracts of the supratentorial brain, consistent with microvascular disease  changes. Vascular: No hyperdense vessel or unexpected calcification. Skull: Normal. Negative for fracture or focal lesion. Sinuses/Orbits: There is mild to moderate severity right maxillary sinus and bilateral ethmoid sinus mucosal thickening. Other: None. IMPRESSION: 1. Generalized cerebral atrophy. 2. No acute intracranial abnormality. 3. Mild to moderate severity right maxillary sinus and bilateral ethmoid sinus disease. Electronically Signed   By: Aram Candela M.D.   On: 12/09/2020 22:58   CT Abdomen Pelvis W Contrast  Result Date: 12/09/2020 CLINICAL DATA:  Failure to thrive.  Patient not eating or drinking. EXAM: CT ABDOMEN AND PELVIS WITH CONTRAST TECHNIQUE: Multidetector CT imaging of the abdomen and pelvis was performed using the standard protocol following bolus administration of intravenous contrast. CONTRAST:  72mL OMNIPAQUE IOHEXOL 350 MG/ML SOLN COMPARISON:  None. FINDINGS: Lower chest: Mild atelectasis is seen within the bilateral lung bases. Small bilateral pleural effusions are noted. Hepatobiliary: No focal liver abnormality is seen. Multiple gallstones are seen within the lumen of an otherwise normal-appearing gallbladder. Pancreas: Unremarkable. No pancreatic ductal dilatation or surrounding inflammatory changes. Spleen: Normal in size without focal abnormality. Adrenals/Urinary Tract: Adrenal glands are unremarkable. Kidneys are normal in size, without renal calculi or hydronephrosis. Small parapelvic renal cysts are seen on the left. Bladder is unremarkable. Stomach/Bowel: There is a small hiatal hernia. The appendix is not visualized. No evidence of bowel dilatation. Numerous diverticula are seen throughout the large bowel. Moderate severity diffuse thickening of the ascending colon, proximal to mid transverse colon and sigmoid colon is noted. Vascular/Lymphatic: Aortic atherosclerosis. No enlarged abdominal or pelvic lymph nodes. Reproductive: Prostate is unremarkable. Other: No  abdominal wall hernia or abnormality. No abdominopelvic ascites. Musculoskeletal: Multilevel degenerative changes are seen throughout the lumbar spine. Metallic density intramedullary rods and compression screw device is are seen within the bilateral proximal femurs. IMPRESSION: 1. Cholelithiasis. 2. Sections of diffuse colonic wall thickening suggestive of moderate severity colitis and/or diverticulitis. 3. Small bilateral pleural effusions. 4. Prior ORIF of the proximal bilateral femurs. Aortic Atherosclerosis (ICD10-I70.0). Electronically Signed   By: Aram Candela M.D.   On: 12/09/2020 23:06   DG Chest Port 1 View  Result Date: 12/09/2020 CLINICAL DATA:  Failure to thrive EXAM: PORTABLE CHEST 1 VIEW COMPARISON:  11/30/2020 FINDINGS: Cardiac shadow is mildly prominent but accentuated by the portable technique. Lungs are well aerated without focal infiltrate or sizable effusion. No edema is seen. No acute bony abnormality is noted. IMPRESSION: No acute abnormality noted. Electronically Signed   By: Alcide Clever M.D.   On: 12/09/2020 21:48    Procedures Procedures   Medications Ordered in ED Medications  0.9 %  sodium chloride infusion (has no administration in time range)  ciprofloxacin (CIPRO) IVPB 400 mg (has no administration in time range)  metroNIDAZOLE (FLAGYL) IVPB 500 mg (has no administration in time range)  iohexol (OMNIPAQUE) 350 MG/ML injection 80 mL (80 mLs Intravenous Contrast Given 12/09/20 2241)    ED Course  I have reviewed the triage vital signs and the nursing notes.  Pertinent labs & imaging results that were available during my care of the patient were reviewed by me and considered in my medical decision making (see chart for details).    MDM Rules/Calculators/A&P                         Patient here mucous membranes slightly dry not terribly dry.  Work-up actually electrolytes very good potassium 3.8.  Liver function tests normal.  No significant leukocytosis  hemoglobin 12.8.  Head CT negative nothing acute.  Chest x-ray without any acute findings.  CT scan abdomen pelvis shows sort of colitis and he has sort of diverticulosis with or could be diverticulitis as well we will treat with IV Cipro and Flagyl and then have him continue Cipro and Flagyl at the assisted living.  No indications for admission however initial troponin was 20.  Delta troponin if has a significant change patient will require admission.    Final Clinical Impression(s) /  ED Diagnoses Final diagnoses:  Colitis  Diverticulitis    Rx / DC Orders ED Discharge Orders          Ordered    metroNIDAZOLE (FLAGYL) 500 MG tablet  3 times daily        12/09/20 2338    ciprofloxacin (CIPRO) 500 MG tablet  Every 12 hours        12/09/20 2338             Vanetta Mulders, MD 12/09/20 2339

## 2020-12-09 NOTE — Discharge Instructions (Addendum)
Take the Cipro and Flagyl as directed as an antibiotic.  Continue to take your Imodium A-D.  Try to hydrate frequently with small sips of fluids.  Would expect improvement over the next couple days.  Return for any new or worse symptoms

## 2020-12-10 LAB — TROPONIN I (HIGH SENSITIVITY): Troponin I (High Sensitivity): 21 ng/L — ABNORMAL HIGH (ref <18)

## 2022-05-18 ENCOUNTER — Emergency Department (HOSPITAL_COMMUNITY)
Admission: EM | Admit: 2022-05-18 | Discharge: 2022-05-18 | Disposition: A | Discharge status: Home / Self Care | Payer: Medicare Other | Attending: Emergency Medicine | Admitting: Emergency Medicine

## 2022-05-18 ENCOUNTER — Emergency Department (HOSPITAL_COMMUNITY): Payer: Medicare Other

## 2022-05-18 ENCOUNTER — Encounter (HOSPITAL_COMMUNITY): Payer: Self-pay | Admitting: Emergency Medicine

## 2022-05-18 DIAGNOSIS — G309 Alzheimer's disease, unspecified: Secondary | ICD-10-CM | POA: Diagnosis not present

## 2022-05-18 DIAGNOSIS — R778 Other specified abnormalities of plasma proteins: Secondary | ICD-10-CM | POA: Insufficient documentation

## 2022-05-18 DIAGNOSIS — S01111A Laceration without foreign body of right eyelid and periocular area, initial encounter: Secondary | ICD-10-CM | POA: Insufficient documentation

## 2022-05-18 DIAGNOSIS — W010XXA Fall on same level from slipping, tripping and stumbling without subsequent striking against object, initial encounter: Secondary | ICD-10-CM | POA: Diagnosis not present

## 2022-05-18 DIAGNOSIS — S0993XA Unspecified injury of face, initial encounter: Secondary | ICD-10-CM | POA: Diagnosis present

## 2022-05-18 DIAGNOSIS — Z8551 Personal history of malignant neoplasm of bladder: Secondary | ICD-10-CM | POA: Diagnosis not present

## 2022-05-18 DIAGNOSIS — W19XXXA Unspecified fall, initial encounter: Secondary | ICD-10-CM

## 2022-05-18 DIAGNOSIS — S0181XA Laceration without foreign body of other part of head, initial encounter: Secondary | ICD-10-CM

## 2022-05-18 LAB — CBC WITH DIFFERENTIAL/PLATELET
Abs Immature Granulocytes: 0.03 10*3/uL (ref 0.00–0.07)
Basophils Absolute: 0.1 10*3/uL (ref 0.0–0.1)
Basophils Relative: 1 %
Eosinophils Absolute: 0 10*3/uL (ref 0.0–0.5)
Eosinophils Relative: 0 %
HCT: 37.3 % — ABNORMAL LOW (ref 39.0–52.0)
Hemoglobin: 12.1 g/dL — ABNORMAL LOW (ref 13.0–17.0)
Immature Granulocytes: 0 %
Lymphocytes Relative: 12 %
Lymphs Abs: 1 10*3/uL (ref 0.7–4.0)
MCH: 30.4 pg (ref 26.0–34.0)
MCHC: 32.4 g/dL (ref 30.0–36.0)
MCV: 93.7 fL (ref 80.0–100.0)
Monocytes Absolute: 0.8 10*3/uL (ref 0.1–1.0)
Monocytes Relative: 10 %
Neutro Abs: 6.2 10*3/uL (ref 1.7–7.7)
Neutrophils Relative %: 77 %
Platelets: 143 10*3/uL — ABNORMAL LOW (ref 150–400)
RBC: 3.98 MIL/uL — ABNORMAL LOW (ref 4.22–5.81)
RDW: 13.2 % (ref 11.5–15.5)
WBC: 8.1 10*3/uL (ref 4.0–10.5)
nRBC: 0 % (ref 0.0–0.2)

## 2022-05-18 LAB — URINALYSIS, ROUTINE W REFLEX MICROSCOPIC
Bilirubin Urine: NEGATIVE
Glucose, UA: NEGATIVE mg/dL
Hgb urine dipstick: NEGATIVE
Ketones, ur: 20 mg/dL — AB
Leukocytes,Ua: NEGATIVE
Nitrite: NEGATIVE
Protein, ur: NEGATIVE mg/dL
Specific Gravity, Urine: 1.016 (ref 1.005–1.030)
pH: 5 (ref 5.0–8.0)

## 2022-05-18 LAB — BASIC METABOLIC PANEL
Anion gap: 9 (ref 5–15)
BUN: 20 mg/dL (ref 8–23)
CO2: 21 mmol/L — ABNORMAL LOW (ref 22–32)
Calcium: 8.9 mg/dL (ref 8.9–10.3)
Chloride: 104 mmol/L (ref 98–111)
Creatinine, Ser: 0.95 mg/dL (ref 0.61–1.24)
GFR, Estimated: >60 mL/min (ref >60)
Glucose, Bld: 112 mg/dL — ABNORMAL HIGH (ref 70–99)
Potassium: 4.3 mmol/L (ref 3.5–5.1)
Sodium: 134 mmol/L — ABNORMAL LOW (ref 135–145)

## 2022-05-18 LAB — TROPONIN I (HIGH SENSITIVITY): Troponin I (High Sensitivity): 26 ng/L — ABNORMAL HIGH (ref <18)

## 2022-05-18 LAB — CK: Total CK: 53 U/L (ref 49–397)

## 2022-05-18 MED ORDER — LIDOCAINE HCL (PF) 1 % IJ SOLN
5.0000 mL | Freq: Once | INTRAMUSCULAR | Status: AC
  Administered 2022-05-18: 5 mL
  Filled 2022-05-18: qty 5

## 2022-05-18 NOTE — ED Notes (Signed)
Male pure wick was placed on Pt

## 2022-05-18 NOTE — ED Provider Notes (Signed)
[facility] Provider Note   CSN: [id number] Arrival date & time: 05/18/22  1423     History  Chief Complaint  Patient presents with   Marcus Hansen is a 89 y.o. male.  89 year old male presents today for evaluation of fall.  This occurred at the nursing home facility.  This was unwitnessed.  Per report patient was laying on the floor for about an hour.  Daughter is at bedside.  She also states he has been weak and has somewhat of a decreased appetite over the past week.  She states that not significantly unusual for him however she is worried that could have contributed to his fall.  Level 5 caveat patient with dementia.  No confirmed syncopal episode.  Daughter states she is concerned he hit his head on the table that is at his bedside.  He has a wound to his right eyebrow.  C-collar in place.  The history is provided by the patient. No language interpreter was used.       Home Medications Prior to Admission medications   Medication Sig Start Date End Date Taking? Authorizing Provider  cetirizine (ZYRTEC) 5 MG tablet Take 5 mg by mouth daily.    [provider]  ciprofloxacin (CIPRO) 500 MG tablet Take 1 tablet (500 mg total) by mouth every 12 (twelve) hours. 12/09/20   Fredia Sorrow, MD  donepezil (ARICEPT) 10 MG tablet Take 10 mg by mouth at bedtime.    [provider]  loperamide (IMODIUM) 2 MG capsule Take 1 capsule (2 mg total) by mouth 4 (four) times daily as needed for diarrhea or loose stools. 12/01/20   Molpus, John, MD  meloxicam (MOBIC) 7.5 MG tablet Take 7.5 mg by mouth daily.    [provider]  memantine (NAMENDA) 5 MG tablet Take 5 mg by mouth 2 (two) times daily.    [provider]  metroNIDAZOLE (FLAGYL) 500 MG tablet Take 1 tablet (500 mg total) by mouth 3 (three) times daily. 12/09/20   Fredia Sorrow, MD  omeprazole (PRILOSEC) 40 MG capsule Take 40 mg by mouth daily.     [provider]      Allergies    Patient has no known allergies.    Review of Systems   Review of Systems  Unable to perform ROS: Dementia    Physical Exam Updated Vital Signs BP 132/83 (BP Location: Right Arm)   Pulse 75   Temp 97.9 F (36.6 C) (Oral)   Resp 18   SpO2 97%  Physical Exam Vitals and nursing note reviewed.  Constitutional:      General: He is not in acute distress.    Appearance: He is ill-appearing.     Comments: Chronically ill appearing.  HENT:     Head: Normocephalic.     Comments: Wound to right eyebrow. Cardiovascular:     Rate and Rhythm: Normal rate and regular rhythm.  Pulmonary:     Effort: Pulmonary effort is normal. No respiratory distress.  Abdominal:     General: There is no distension.     Palpations: Abdomen is soft.     Tenderness: There is no abdominal tenderness. There is no guarding.  Musculoskeletal:        General: Normal range of motion.     Comments: Thoracic lumbar spine without TTP, or step-offs.  All major joints in upper and lower extremities without tenderness to palpation.  Good passive range of  motion.  Neurological:     Mental Status: He is alert.     ED Results / Procedures / Treatments   Labs (all labs ordered are listed, but only abnormal results are displayed) Labs Reviewed  CBC WITH DIFFERENTIAL/PLATELET  BASIC METABOLIC PANEL  URINALYSIS, ROUTINE W REFLEX MICROSCOPIC  CK    EKG None  Radiology No results found.  Procedures .Marland KitchenLaceration Repair  Date/Time: 05/18/2022 7:04 PM  Performed by: Evlyn Courier, PA-C Authorized by: Evlyn Courier, PA-C   Consent:    Consent obtained:  Verbal   Consent given by:  Healthcare agent (daughter)   Risks, benefits, and alternatives were discussed: yes     Risks discussed:  Infection, retained foreign body, poor cosmetic result and poor wound healing   Alternatives discussed:  No treatment Universal protocol:    Procedure explained and questions answered to  patient or proxy's satisfaction: yes     Relevant documents present and verified: yes     Test results available: yes     Imaging studies available: yes     Patient identity confirmed:  Verbally with patient and arm band Anesthesia:    Anesthesia method:  Local infiltration   Local anesthetic:  Lidocaine 1% w/o epi Laceration details:    Location:  Face   Face location:  R eyebrow   Length (cm):  1.5 Pre-procedure details:    Preparation:  Patient was prepped and draped in usual sterile fashion and imaging obtained to evaluate for foreign bodies Exploration:    Contaminated: no   Treatment:    Area cleansed with:  Povidone-iodine and chlorhexidine   Amount of cleaning:  Standard   Irrigation solution:  Sterile saline   Irrigation volume:  250   Irrigation method:  Syringe   Debridement:  None   Undermining:  None Skin repair:    Repair method:  Sutures   Suture size:  5-0   Suture material:  Prolene   Suture technique:  Simple interrupted   Number of sutures:  3 Approximation:    Approximation:  Close Repair type:    Repair type:  Simple Post-procedure details:    Dressing:  Open (no dressing)   Procedure completion:  Tolerated well, no immediate complications     Medications Ordered in ED Medications - No data to display  ED Course/ Medical Decision Making/ A&P Clinical Course as of 05/18/22 1903  Thu May 18, 2022  1758 CT Cervical Spine Wo Contrast [AA]    Clinical Course User Index [AA] Evlyn Courier, PA-C                             Medical Decision Making Amount and/or Complexity of Data Reviewed Labs: ordered. Radiology: ordered. Decision-making details documented in ED Course.  Risk Prescription drug management.   Medical Decision Making / ED Course   This patient presents to the ED for concern of fall, this involves an extensive number of treatment options, and is a complaint that carries with it a high risk of complications and morbidity.  The  differential diagnosis includes head injury, trauma to the spine intracranial injury  MDM: 89 year old male presents following a fall.  Daughter is also concerned regarding weakness and decreased appetite over the week.  Will obtain CT head, cervical spine, pelvic x-ray, chest x-ray, UA, EKG, and basic blood work including CK due to prolonged time on the floor.  Wound noted to right eyebrow.  Will require suture  repair.  Repaired with 3 Prolene stitches.  Wound care discussed.  Discussed these will need to be removed in 7 days.  Workup otherwise reassuring.  Without leukocytosis, hemoglobin of 12.1.  Platelets of 143.  BMP with preserved renal function.  CK within normal.  Troponin of 26.  EKG with some changes compared to prior however negative for acute ACS.  Given the elevated troponin we discussed repeating this to ensure they are flat.  However patient becoming increasingly agitated and he is without chest pain.  Daughter states she would like to take him home and if he has any worsening symptoms they will return.  Feel this is reasonable.  Patient discharged.  Return precaution discussed. Patient discussed with attending.  X-ray shows signs of volume overload.  No other physical exam findings of CHF.  Not hypoxic.  Did ask nurse to ambulate.   Additional history obtained: -Additional history obtained from daughter who was at bedside -External records from outside source obtained and reviewed including: Chart review including previous notes, labs, imaging, consultation notes   Lab Tests: -I ordered, reviewed, and interpreted labs.   The pertinent results include:   Labs Reviewed  CBC WITH DIFFERENTIAL/PLATELET  BASIC METABOLIC PANEL  URINALYSIS, ROUTINE W REFLEX MICROSCOPIC  CK      EKG  EKG Interpretation  Date/Time:    Ventricular Rate:    PR Interval:    QRS Duration:   QT Interval:    QTC Calculation:   R Axis:     Text Interpretation:           Imaging Studies  ordered: I ordered imaging studies including CT head, CT cervical spine, chest x-ray, pelvic x-ray I independently visualized and interpreted imaging. I agree with the radiologist interpretation   Medicines ordered and prescription drug management: No orders of the defined types were placed in this encounter.   -I have reviewed the patients home medicines and have made adjustments as needed   Cardiac Monitoring: The patient was maintained on a cardiac monitor.  I personally viewed and interpreted the cardiac monitored which showed an underlying rhythm of: Normal sinus rhythm   Reevaluation: After the interventions noted above, I reevaluated the patient and found that they have :stayed the same  Co morbidities that complicate the patient evaluation  Past Medical History:  Diagnosis Date   Alzheimer's disease ([place])    Iron deficiency anemia    Screening, malignant neoplasm, bladder       Dispostion: Patient discharged in stable condition.  Return precaution discussed.  Patient voices understanding and is in agreement with the plan.   Final Clinical Impression(s) / ED Diagnoses Final diagnoses:  Fall, initial encounter  Facial laceration, initial encounter    Rx / DC Orders ED Discharge Orders     None         Evlyn Courier, PA-C 05/18/22 1911    Elgie Congo, MD 05/19/22 1453

## 2022-05-18 NOTE — ED Triage Notes (Signed)
Pt had unwitnessed fall at [facility]. Not on thinners. Pt reports he tripped. Walker was on it's side. He was on floor for an hour. Skin tear on temple over right eye. C collar applied by [facility] per protocol. Baseline back pain per patient with no increase in pain. Dementia but hx of A&Ox 2 which is his baseline.

## 2022-05-18 NOTE — Progress Notes (Signed)
[facility] ED [address] [facility])  [facility] hospital liaison note      This patient is a current hospice patient with [facility], admitted with a terminal diagnosis of severe protein calorie malnutrition.     [facility] will continue to follow for any discharge planning needs and to coordinate continuation of hospice care.   Please don't hesitate to call with any Hospice related questions or concerns.    Thank you for the opportunity to participate in this patient's care. Jhonnie Garner, Therapist, sports, BSN, [facility] Liaison [phone]

## 2022-05-18 NOTE — Discharge Instructions (Signed)
Your workup today was overall reassuring.  CT scan did not show any concerning findings.  Blood work was reassuring.  We discussed that your heart enzyme was slightly elevated and we recommend that we repeat and get a second 1.  He had similar elevations last year to this marker.  After this discussion you did not want to stay.  However you state you will return if you have any worsening symptoms.  For any concerning symptoms return to the emergency department.

## 2022-07-05 DEATH — deceased

## 2022-09-23 IMAGING — CT CT CERVICAL SPINE W/O CM
3 of 4 series · 10 of 33 positions shown, 12 images · non-contrast
Comparison: None.

CLINICAL DATA: Un witnessed fall, ecchymosis

EXAM:
CT HEAD WITHOUT CONTRAST
CT MAXILLOFACIAL WITHOUT CONTRAST
CT CERVICAL SPINE WITHOUT CONTRAST
TECHNIQUE: Multidetector CT imaging of the head, cervical spine, and
maxillofacial structures were performed using the standard protocol
without intravenous contrast. Multiplanar CT image reconstructions
of the cervical spine and maxillofacial structures were also
generated.

[Series 6: orthogonal axials · axial · 0.23mm/px · z∈[+1448,+1527]mm · 2 of 114 slices shown, 3 images]
[im 33/114  soft-tissue]
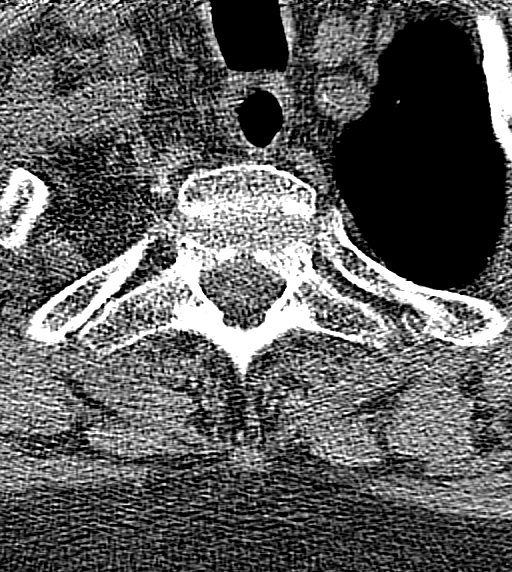
[im 33/114  bone]
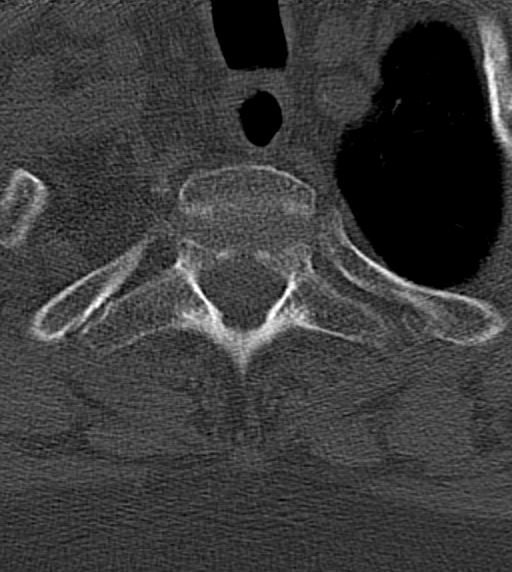
[im 81/114  bone]
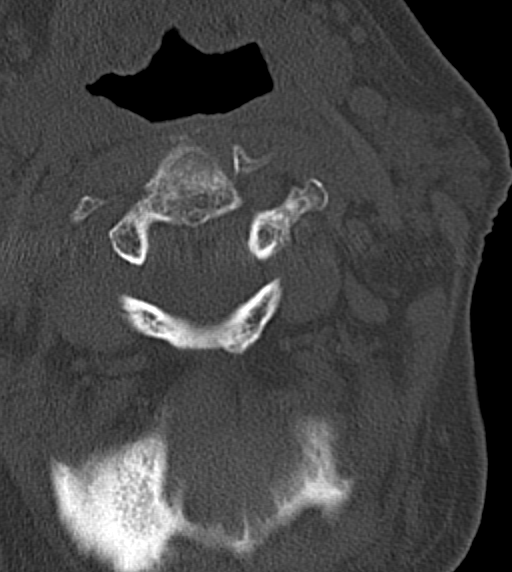

[Series 7: coronal bone · coronal · 0.23mm/px · 3 of 68 slices shown]
[im 21/68  bone]
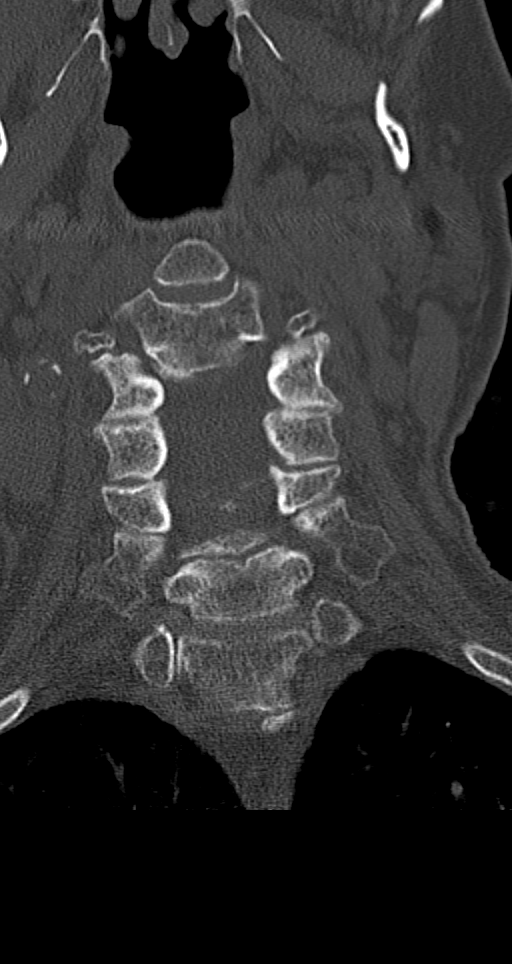
[im 30/68  bone]
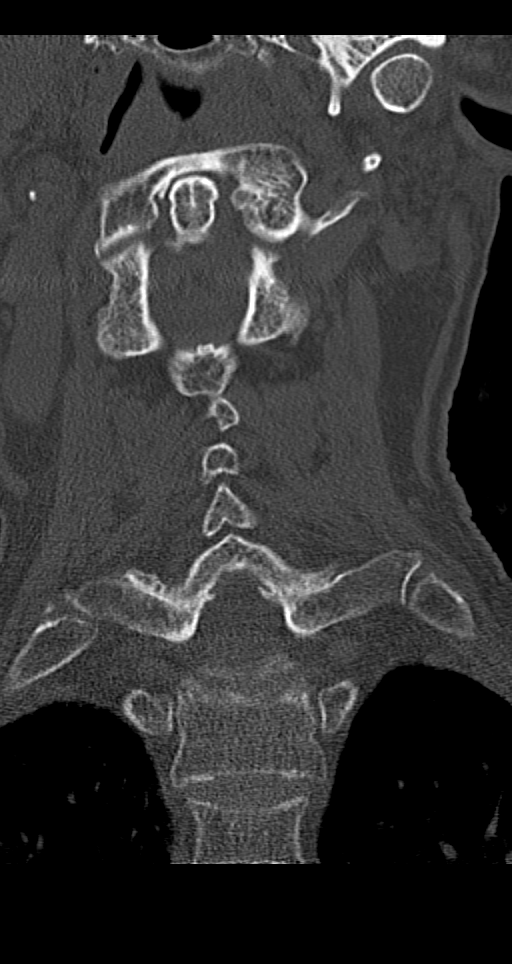
[im 38/68  bone]
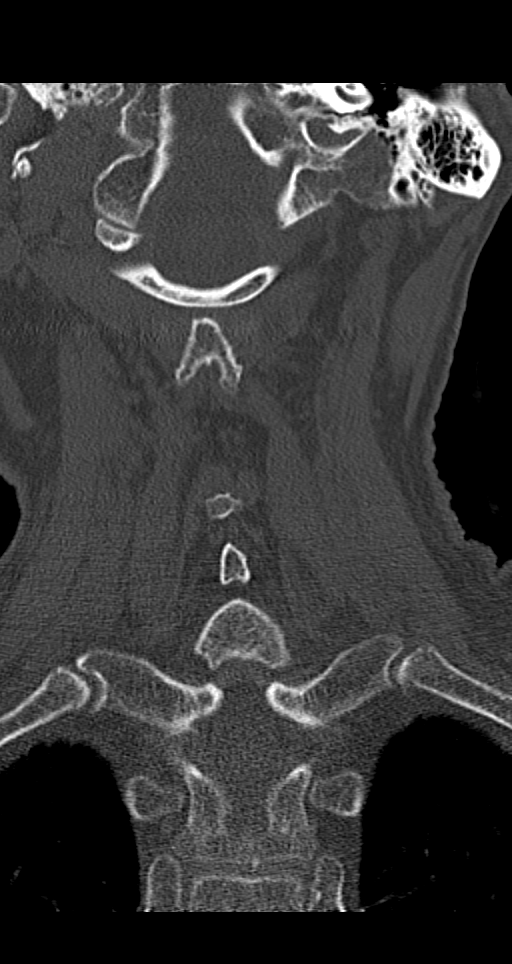

[Series 8: sagittal bone · sagittal · 0.26mm/px · 5 of 61 slices shown, 6 images]
[im 21/61  bone]
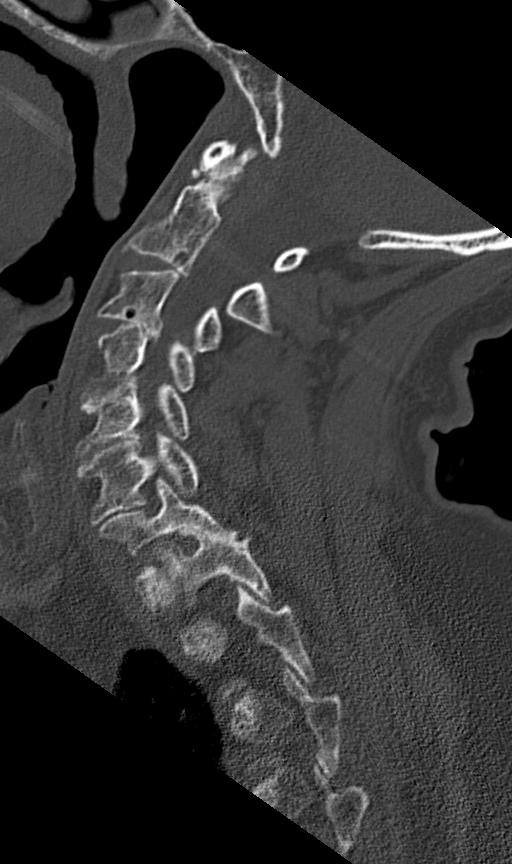
[im 26/61  bone]
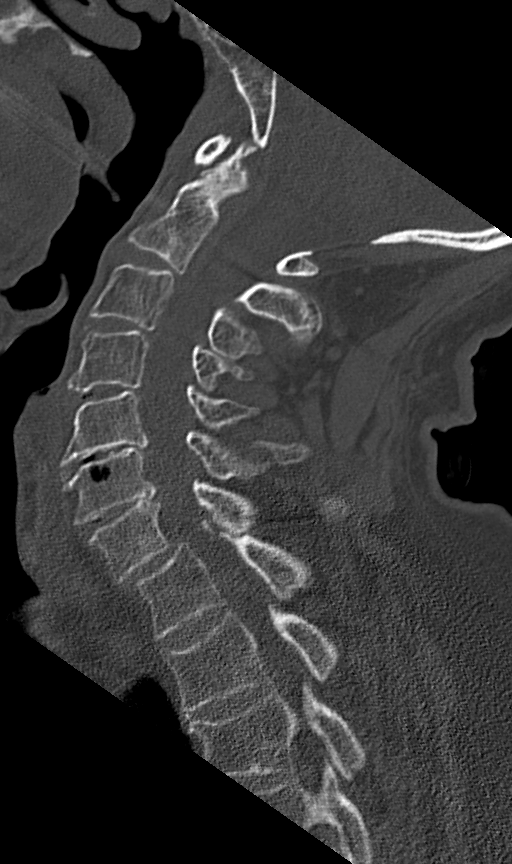
[im 31/61  soft-tissue]
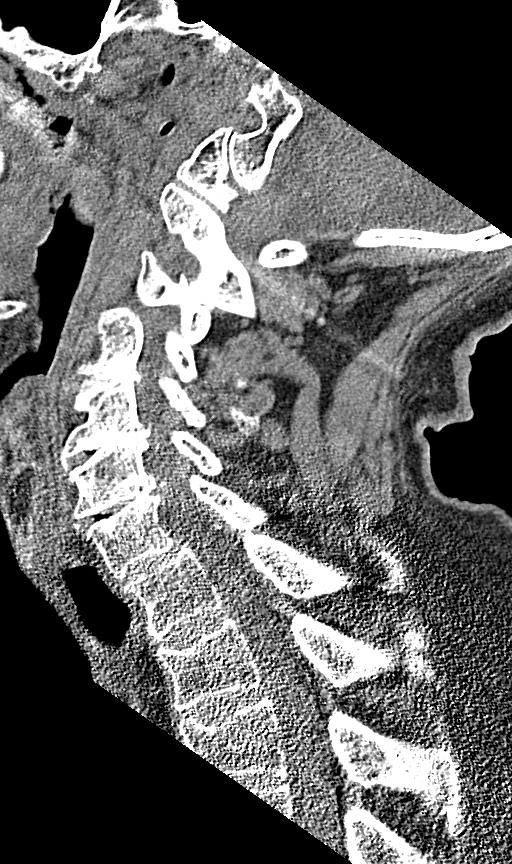
[im 31/61  bone]
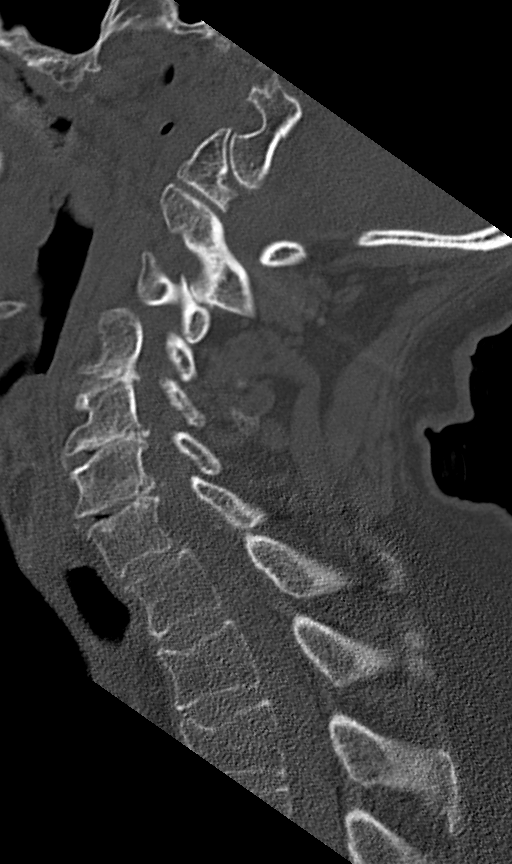
[im 36/61  bone]
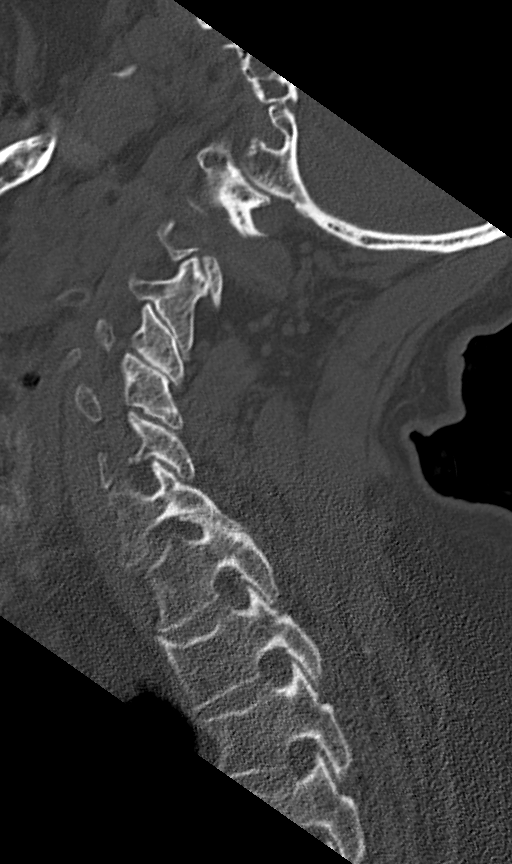
[im 41/61  bone]
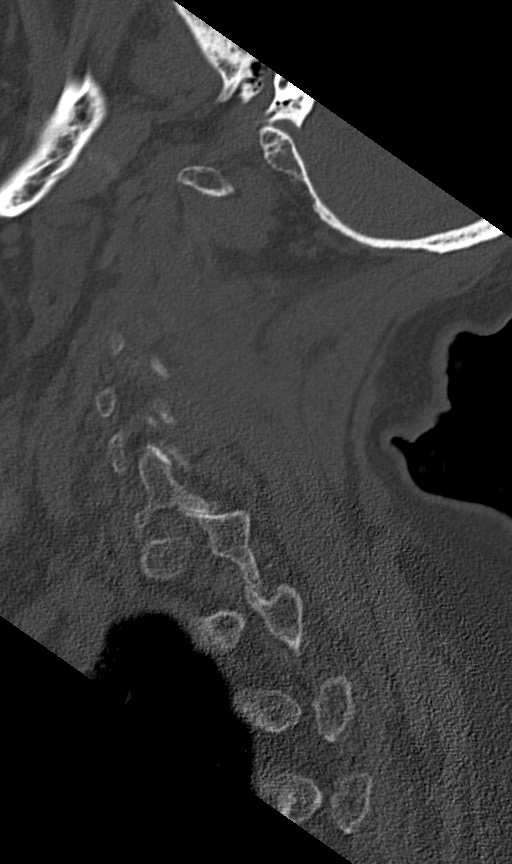

[10 of 33 positions shown; findings below may reference images not displayed]

FINDINGS: CT HEAD FINDINGS

Brain: No acute infarct or hemorrhage. Lateral ventricles and
midline structures are unremarkable. No acute extra-axial fluid
collections. No mass effect. Diffuse cerebral atrophy is age
appropriate.

Vascular: No hyperdense vessel or unexpected calcification.

Skull: Minimal right frontal scalp hematoma. No underlying fracture.
Remainder of the calvarium is unremarkable.

Other: None.

CT MAXILLOFACIAL FINDINGS

Osseous: No fracture or mandibular dislocation. No destructive
process.

Orbits: Negative. No traumatic or inflammatory finding.

Sinuses: Clear.

Soft tissues: Mild right periorbital soft tissue edema.

CT CERVICAL SPINE FINDINGS

Alignment: Alignment is anatomic.

Skull base and vertebrae: No acute fracture. No primary bone lesion
or focal pathologic process.

Soft tissues and spinal canal: No prevertebral fluid or swelling. No
visible canal hematoma.

Disc levels: Multilevel spondylosis and facet hypertrophy, most
pronounced at C4-5, C5-6, and C6-7.

Upper chest: Airway is patent.  Lung apices are clear.

Other: Reconstructed images demonstrate no additional findings.
IMPRESSION: 1. No acute intracranial process.
2. No acute facial bone fracture.
3. No acute cervical spine fracture.
4. Minimal right periorbital soft tissue swelling.
5. Multilevel cervical spondylosis and facet hypertrophy.
